# Patient Record
Sex: Female | Born: 2000 | Hispanic: No | Marital: Single | Smoking: Never smoker
Health system: Southern US, Community
[De-identification: ages and names within clinical notes are randomized; demographics above are authoritative.]

## PROBLEM LIST (undated history)

## (undated) DIAGNOSIS — A549 Gonococcal infection, unspecified: Secondary | ICD-10-CM

## (undated) DIAGNOSIS — R011 Cardiac murmur, unspecified: Secondary | ICD-10-CM

## (undated) HISTORY — PX: NO PAST SURGERIES: SHX2092

## (undated) HISTORY — DX: Gonococcal infection, unspecified: A54.9

## (undated) HISTORY — DX: Cardiac murmur, unspecified: R01.1

---
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
## 2019-01-12 ENCOUNTER — Encounter: Payer: Medicaid Other | Admitting: Women's Health

## 2019-01-17 ENCOUNTER — Encounter: Payer: Medicaid Other | Admitting: Women's Health

## 2019-01-21 NOTE — L&D Delivery Note (Addendum)
OB/GYN Faculty Practice Delivery Note  Daisy Thompson is a 19 y.o. G2P1001 s/p NSVD at [redacted]w[redacted]d. She was admitted for SOL.   ROM: 0h 6m with clear fluid GBS Status: unknown   Maximum Maternal Temperature: 98.7 F  Labor Progress: . Patient presented to L&D for SOL. Initial SVE: 8cm. Labor course was uncomplicated. 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 AROM at 1015. She then progressed to complete.   Delivery Date/Time: 03/24/2019 @1048  Delivery: Called to room and patient was complete and pushing. Head position was OA and delivered over the perineum causing a 2nd degree tear. Nuchal cord present and reduced. Shoulder and body delivered in usual fashion. Of note, after baby was delivered, partial cord avulsion noted near infant umbilical insertion. Infant without spontaneous cry and with poor tone so cord was clamped and cut by Dr. immediately and the baby was taken to the warmer, dried and stimulated. Cord blood drawn. Placenta delivered spontaneously with gentle cord traction. Fundus firm with massage and pitocin started. Labia, perineum, vagina, and cervix inspected and significant for 2nd degree perineal laceration with 3-0 vicryl in standard fashion.  Baby Weight:  3865g  Cord: central insertion, 3 vessel Placenta: Sent to pathology Complications: None Lacerations: 2nd degree perineal laceration, and was repaired in the standard fashion EBL: 350cc Analgesia: Non-medicated; local lidocaine for repair  Infant: APGAR (1 MIN):  6 APGAR (5 MINS): 9  Raynald Kemp MD, PGY-1 OBGYN Faculty Teaching Service  03/24/2019, 11:22 AM  OB FELLOW DELIVERY ATTESTATION  I was gloved and present for the delivery in its entirety, and I agree with the above resident's note.    05/24/2019, MD [facility] Family Medicine Fellow, [facility] for [facility]

## 2019-01-26 ENCOUNTER — Other Ambulatory Visit: Payer: Self-pay

## 2019-01-26 ENCOUNTER — Ambulatory Visit (INDEPENDENT_AMBULATORY_CARE_PROVIDER_SITE_OTHER): Payer: Medicaid Other | Admitting: Advanced Practice Midwife

## 2019-01-26 VITALS — BP 138/77 | HR 97 | Wt 166.0 lb

## 2019-01-26 DIAGNOSIS — Z331 Pregnant state, incidental: Secondary | ICD-10-CM

## 2019-01-26 DIAGNOSIS — A749 Chlamydial infection, unspecified: Secondary | ICD-10-CM | POA: Diagnosis not present

## 2019-01-26 DIAGNOSIS — Z3483 Encounter for supervision of other normal pregnancy, third trimester: Secondary | ICD-10-CM

## 2019-01-26 DIAGNOSIS — Z3A32 32 weeks gestation of pregnancy: Secondary | ICD-10-CM

## 2019-01-26 DIAGNOSIS — Z1389 Encounter for screening for other disorder: Secondary | ICD-10-CM

## 2019-01-26 LAB — POCT URINALYSIS DIPSTICK OB
Blood, UA: NEGATIVE
Glucose, UA: NEGATIVE
Ketones, UA: NEGATIVE
Leukocytes, UA: NEGATIVE
Nitrite, UA: NEGATIVE

## 2019-01-26 NOTE — Progress Notes (Signed)
   LOW-RISK PREGNANCY VISIT Patient name: Daisy Thompson MRN [id number]  Date of birth: June 23, 2000 Chief Complaint:   Routine Prenatal Visit  History of Present Illness:   Daisy Thompson is a 19 y.o. G77P1001 female at [redacted]w[redacted]d with an Estimated Date of Delivery: 03/17/19 being seen today for ongoing management of a low-risk pregnancy.  Today she reports no complaints. Contractions: Not present. Vag. Bleeding: None.  Movement: Present. denies leaking of fluid. Review of Systems:   Pertinent items are noted in HPI Denies abnormal vaginal discharge w/ itching/odor/irritation, headaches, visual changes, shortness of breath, chest pain, abdominal pain, severe nausea/vomiting, or problems with urination or bowel movements unless otherwise stated above. Pertinent History Reviewed:  Reviewed past medical,surgical, social, obstetrical and family history.  Reviewed problem list, medications and allergies. Physical Assessment:   Vitals:   01/26/19 1103  BP: 138/77  Pulse: 97  Weight: 166 lb (75.3 kg)  Body mass index is 27.62 kg/m.        Physical Examination:   General appearance: Well appearing, and in no distress  Mental status: Alert, oriented to person, place, and time  Skin: Warm & dry  Cardiovascular: Normal heart rate noted  Respiratory: Normal respiratory effort, no distress  Abdomen: Soft, gravid, nontender  Pelvic: Cervical exam deferred         Extremities: Edema: None  Fetal Status: Fetal Heart Rate (bpm): 134 Fundal Height: 31 cm Movement: Present    Results for orders placed or performed in visit on 01/26/19 (from the past 24 hour(s))  POC Urinalysis Dipstick OB   Collection Time: 01/26/19 11:08 AM  Result Value Ref Range   Color, UA     Clarity, UA     Glucose, UA Negative Negative   Bilirubin, UA     Ketones, UA neg    Spec Grav, UA     Blood, UA neg    pH, UA     POC,PROTEIN,UA Small (1+) Negative, Trace, Small (1+), Moderate (2+), Large (3+), 4+   Urobilinogen, UA       Nitrite, UA neg    Leukocytes, UA Negative Negative   Appearance     Odor      Assessment & Plan:  1) Low-risk pregnancy G2P1001 at [redacted]w[redacted]d with an Estimated Date of Delivery: 03/17/19   2) Hx depression, doing well without Zoloft  3) +chlam 12/01/18> took tx > TOC today   Meds: No orders of the defined types were placed in this encounter.  Labs/procedures today: GC/chlam  Plan:  Continue routine obstetrical care   Reviewed: Preterm labor symptoms and general obstetric precautions including but not limited to vaginal bleeding, contractions, leaking of fluid and fetal movement were reviewed in detail with the patient.  All questions were answered. Unsure if has home bp cuff. Check bp weekly, let us know if >140/90.   Follow-up: Return in about 3 weeks (around 02/16/2019) for LROB, in person.  Orders Placed This Encounter  Procedures  . GC/Chlamydia Probe Amp  . POC Urinalysis Dipstick OB   Arabella Merles [facility] 01/26/2019 11:42 AM

## 2019-01-26 NOTE — Patient Instructions (Signed)
Daisy Thompson, I greatly value your feedback.  If you receive a survey following your visit with [place] today, we appreciate you taking the time to fill it out.  Thanks, Philipp Deputy, CNM  [facility] HAS MOVED!!! It is now [facility] at [facility] ([address] [place], [place] [address]) Entrance located off of [address] Free 24/7 valet parking    Go to [email or web address] to register for FREE online childbirth classes   Call the office [phone]) or go to [facility] if:  You begin to have strong, frequent contractions  Your water breaks.  Sometimes it is a big gush of fluid, sometimes it is just a trickle that keeps getting your panties wet or running down your legs  You have vaginal bleeding.  It is normal to have a small amount of spotting if your cervix was checked.   You don't feel your baby moving like normal.  If you don't, get you something to eat and drink and lay down and focus on feeling your baby move.  You should feel at least 10 movements in 2 hours.  If you don't, you should call the office or go to [facility].    Tdap Vaccine  It is recommended that you get the Tdap vaccine during the third trimester of EACH pregnancy to help protect your baby from getting pertussis (whooping cough)  27-36 weeks is the BEST time to do this so that you can pass the protection on to your baby. During pregnancy is better than after pregnancy, but if you are unable to get it during pregnancy it will be offered at the hospital.   You can get this vaccine with [place], at the health department, your family doctor, or some local pharmacies  Everyone who will be around your baby should also be up-to-date on their vaccines before the baby comes. Adults (who are not pregnant) only need 1 dose of Tdap during adulthood.   [place] Pediatricians/Family Doctors:  [facility] [phone]            [facility] [phone]                  [facility] [phone] (usually not accepting new patients unless you have family there already, you are always welcome to call and ask)       [facility] Department [phone]       [facility] Pediatricians/Family Doctors:   [facility]: [phone]  [facility]: [phone]  [facility]: [phone]  [facility] Doctors:   [facility]: [phone]   [facility]: [phone]  [facility] Doctors:  [facility]: [phone]   Home Blood Pressure Monitoring for Patients   Your provider has recommended that you check your blood pressure (BP) at least once a week at home. If you do not have a blood pressure cuff at home, one will be provided for you. Contact your provider if you have not received your monitor within 1 week.   Helpful Tips for Accurate Home Blood Pressure Checks  . Don't smoke, exercise, or drink caffeine 30 minutes before checking your BP . Use the restroom before checking your BP (a full bladder can raise your pressure) . Relax in a comfortable upright chair . Feet on the ground . Left arm resting comfortably on a flat surface at the level of your heart . Legs uncrossed . Back supported . Sit quietly and don't talk .  Place the cuff on your bare arm . Adjust snuggly, so that only two fingertips can fit between your skin and the top of the cuff . Check 2 readings separated by at least one minute . Keep a log of your BP readings . For a visual, please reference this diagram: http://ccnc.care/bpdiagram  Provider Name: [facility]     Phone: [phone]  Zone 1: ALL CLEAR  Continue to monitor your symptoms:  . BP reading is less than 140 (top number) or less than 90 (bottom number)  . No right upper stomach pain . No headaches or seeing spots . No feeling nauseated or throwing up . No swelling in face and  hands  Zone 2: CAUTION Call your doctor's office for any of the following:  . BP reading is greater than 140 (top number) or greater than 90 (bottom number)  . Stomach pain under your ribs in the middle or right side . Headaches or seeing spots . Feeling nauseated or throwing up . Swelling in face and hands  Zone 3: EMERGENCY  Seek immediate medical care if you have any of the following:  . BP reading is greater than160 (top number) or greater than 110 (bottom number) . Severe headaches not improving with Tylenol . Serious difficulty catching your breath . Any worsening symptoms from Zone 2   Third Trimester of Pregnancy The third trimester is from week 29 through week 42, months 7 through 9. The third trimester is a time when the fetus is growing rapidly. At the end of the ninth month, the fetus is about 20 inches in length and weighs 6-10 pounds.  BODY CHANGES Your body goes through many changes during pregnancy. The changes vary from woman to woman.   Your weight will continue to increase. You can expect to gain 25-35 pounds (11-16 kg) by the end of the pregnancy.  You may begin to get stretch marks on your hips, abdomen, and breasts.  You may urinate more often because the fetus is moving lower into your pelvis and pressing on your bladder.  You may develop or continue to have heartburn as a result of your pregnancy.  You may develop constipation because certain hormones are causing the muscles that push waste through your intestines to slow down.  You may develop hemorrhoids or swollen, bulging veins (varicose veins).  You may have pelvic pain because of the weight gain and pregnancy hormones relaxing your joints between the bones in your pelvis. Backaches may result from overexertion of the muscles supporting your posture.  You may have changes in your hair. These can include thickening of your hair, rapid growth, and changes in texture. Some women also have hair loss during  or after pregnancy, or hair that feels dry or thin. Your hair will most likely return to normal after your baby is born.  Your breasts will continue to grow and be tender. A yellow discharge may leak from your breasts called colostrum.  Your belly button may stick out.  You may feel short of breath because of your expanding uterus.  You may notice the fetus "dropping," or moving lower in your abdomen.  You may have a bloody mucus discharge. This usually occurs a few days to a week before labor begins.  Your cervix becomes thin and soft (effaced) near your due date. WHAT TO EXPECT AT YOUR PRENATAL EXAMS  You will have prenatal exams every 2 weeks until week 36. Then, you will have weekly prenatal exams. During  a routine prenatal visit:  You will be weighed to make sure you and the fetus are growing normally.  Your blood pressure is taken.  Your abdomen will be measured to track your baby's growth.  The fetal heartbeat will be listened to.  Any test results from the previous visit will be discussed.  You may have a cervical check near your due date to see if you have effaced. At around 36 weeks, your caregiver will check your cervix. At the same time, your caregiver will also perform a test on the secretions of the vaginal tissue. This test is to determine if a type of bacteria, Group B streptococcus, is present. Your caregiver will explain this further. Your caregiver may ask you:  What your birth plan is.  How you are feeling.  If you are feeling the baby move.  If you have had any abnormal symptoms, such as leaking fluid, bleeding, severe headaches, or abdominal cramping.  If you have any questions. Other tests or screenings that may be performed during your third trimester include:  Blood tests that check for low iron levels (anemia).  Fetal testing to check the health, activity level, and growth of the fetus. Testing is done if you have certain medical conditions or if  there are problems during the pregnancy. FALSE LABOR You may feel small, irregular contractions that eventually go away. These are called Braxton Hicks contractions, or false labor. Contractions may last for hours, days, or even weeks before true labor sets in. If contractions come at regular intervals, intensify, or become painful, it is best to be seen by your caregiver.  SIGNS OF LABOR   Menstrual-like cramps.  Contractions that are 5 minutes apart or less.  Contractions that start on the top of the uterus and spread down to the lower abdomen and back.  A sense of increased pelvic pressure or back pain.  A watery or bloody mucus discharge that comes from the vagina. If you have any of these signs before the 37th week of pregnancy, call your caregiver right away. You need to go to the hospital to get checked immediately. HOME CARE INSTRUCTIONS   Avoid all smoking, herbs, alcohol, and unprescribed drugs. These chemicals affect the formation and growth of the baby.  Follow your caregiver's instructions regarding medicine use. There are medicines that are either safe or unsafe to take during pregnancy.  Exercise only as directed by your caregiver. Experiencing uterine cramps is a good sign to stop exercising.  Continue to eat regular, healthy meals.  Wear a good support bra for breast tenderness.  Do not use hot tubs, steam rooms, or saunas.  Wear your seat belt at all times when driving.  Avoid raw meat, uncooked cheese, cat litter boxes, and soil used by cats. These carry germs that can cause birth defects in the baby.  Take your prenatal vitamins.  Try taking a stool softener (if your caregiver approves) if you develop constipation. Eat more high-fiber foods, such as fresh vegetables or fruit and whole grains. Drink plenty of fluids to keep your urine clear or pale yellow.  Take warm sitz baths to soothe any pain or discomfort caused by hemorrhoids. Use hemorrhoid cream if your  caregiver approves.  If you develop varicose veins, wear support hose. Elevate your feet for 15 minutes, 3-4 times a day. Limit salt in your diet.  Avoid heavy lifting, wear low heal shoes, and practice good posture.  Rest a lot with your legs elevated if you  have leg cramps or low back pain.  Visit your dentist if you have not gone during your pregnancy. Use a soft toothbrush to brush your teeth and be gentle when you floss.  A sexual relationship may be continued unless your caregiver directs you otherwise.  Do not travel far distances unless it is absolutely necessary and only with the approval of your caregiver.  Take prenatal classes to understand, practice, and ask questions about the labor and delivery.  Make a trial run to the hospital.  Pack your hospital bag.  Prepare the baby's nursery.  Continue to go to all your prenatal visits as directed by your caregiver. SEEK MEDICAL CARE IF:  You are unsure if you are in labor or if your water has broken.  You have dizziness.  You have mild pelvic cramps, pelvic pressure, or nagging pain in your abdominal area.  You have persistent nausea, vomiting, or diarrhea.  You have a bad smelling vaginal discharge.  You have pain with urination. SEEK IMMEDIATE MEDICAL CARE IF:   You have a fever.  You are leaking fluid from your vagina.  You have spotting or bleeding from your vagina.  You have severe abdominal cramping or pain.  You have rapid weight loss or gain.  You have shortness of breath with chest pain.  You notice sudden or extreme swelling of your face, hands, ankles, feet, or legs.  You have not felt your baby move in over an hour.  You have severe headaches that do not go away with medicine.  You have vision changes. Document Released: 12/31/2000 Document Revised: 01/11/2013 Document Reviewed: 03/09/2012 [facility] Patient Information 2015 [place], [place]. This information is not intended to replace advice  given to you by your health care provider. Make sure you discuss any questions you have with your health care provider.  PROTECT YOURSELF & YOUR BABY FROM THE FLU! Because you are pregnant, we at [facility], along with the Centers for Disease Control (CDC), recommend that you receive the flu vaccine to protect yourself and your baby from the flu. The flu is more likely to cause severe illness in pregnant women than in women of reproductive age who are not pregnant. Changes in the immune system, heart, and lungs during pregnancy make pregnant women (and women up to two weeks postpartum) more prone to severe illness from flu, including illness resulting in hospitalization. Flu also may be harmful for a pregnant woman's developing baby. A common flu symptom is fever, which may be associated with neural tube defects and other adverse outcomes for a developing baby. Getting vaccinated can also help protect a baby after birth from flu. (Mom passes antibodies onto the developing baby during her pregnancy.)  A Flu Vaccine is the Best Protection Against Flu Getting a flu vaccine is the first and most important step in protecting against flu. Pregnant women should get a flu shot and not the live attenuated influenza vaccine (LAIV), also known as nasal spray flu vaccine. Flu vaccines given during pregnancy help protect both the mother and her baby from flu. Vaccination has been shown to reduce the risk of flu-associated acute respiratory infection in pregnant women by up to one-half. A 2018 study showed that getting a flu shot reduced a pregnant woman's risk of being hospitalized with flu by an average of 40 percent. Pregnant women who get a flu vaccine are also helping to protect their babies from flu illness for the first several months after their birth, when they are too  young to get vaccinated.   A Long Record of Safety for Flu Shots in Pregnant Women Flu shots have been given to millions of pregnant women over  many years with a good safety record. There is a lot of evidence that flu vaccines can be given safely during pregnancy; though these data are limited for the first trimester. The CDC recommends that pregnant women get vaccinated during any trimester of their pregnancy. It is very important for pregnant women to get the flu shot.   Other Preventive Actions In addition to getting a flu shot, pregnant women should take the same everyday preventive actions the CDC recommends of everyone, including covering coughs, washing hands often, and avoiding people who are sick.  Symptoms and Treatment If you get sick with flu symptoms call your doctor right away. There are antiviral drugs that can treat flu illness and prevent serious flu complications. The CDC recommends prompt treatment for people who have influenza infection or suspected influenza infection and who are at high risk of serious flu complications, such as people with asthma, diabetes (including gestational diabetes), or heart disease. Early treatment of influenza in hospitalized pregnant women has been shown to reduce the length of the hospital stay.  Symptoms Flu symptoms include fever, cough, sore throat, runny or stuffy nose, body aches, headache, chills and fatigue. Some people may also have vomiting and diarrhea. People may be infected with the flu and have respiratory symptoms without a fever.  Early Treatment is Important for Pregnant Women Treatment should begin as soon as possible because antiviral drugs work best when started early (within 48 hours after symptoms start). Antiviral drugs can make your flu illness milder and make you feel better faster. They may also prevent serious health problems that can result from flu illness. Oral oseltamivir (Tamiflu) is the preferred treatment for pregnant women because it has the most studies available to suggest that it is safe and beneficial. Antiviral drugs require a prescription from your  provider. Having a fever caused by flu infection or other infections early in pregnancy may be linked to birth defects in a baby. In addition to taking antiviral drugs, pregnant women who get a fever should treat their fever with Tylenol (acetaminophen) and contact their provider immediately.  When to [place] If you are pregnant and have any of these signs, seek care immediately:  Difficulty breathing or shortness of breath  Pain or pressure in the chest or abdomen  Sudden dizziness  Confusion  Severe or persistent vomiting  High fever that is not responding to Tylenol (or store brand equivalent)  Decreased or no movement of your baby  [email or web address]

## 2019-01-27 LAB — GC/CHLAMYDIA PROBE AMP
Chlamydia trachomatis, NAA: NEGATIVE
Neisseria Gonorrhoeae by PCR: NEGATIVE

## 2019-02-09 ENCOUNTER — Encounter: Payer: Medicaid Other | Admitting: Obstetrics and Gynecology

## 2019-02-10 ENCOUNTER — Ambulatory Visit (INDEPENDENT_AMBULATORY_CARE_PROVIDER_SITE_OTHER): Payer: Medicaid Other | Admitting: Obstetrics & Gynecology

## 2019-02-10 ENCOUNTER — Other Ambulatory Visit: Payer: Self-pay

## 2019-02-10 VITALS — BP 112/66 | HR 96 | Wt 167.0 lb

## 2019-02-10 DIAGNOSIS — Z7689 Persons encountering health services in other specified circumstances: Secondary | ICD-10-CM | POA: Diagnosis not present

## 2019-02-10 DIAGNOSIS — Z3A35 35 weeks gestation of pregnancy: Secondary | ICD-10-CM

## 2019-02-10 DIAGNOSIS — Z3493 Encounter for supervision of normal pregnancy, unspecified, third trimester: Secondary | ICD-10-CM

## 2019-02-10 NOTE — Progress Notes (Signed)
   LOW-RISK PREGNANCY VISIT Patient name: Daisy Thompson MRN [id number]  Date of birth: [birth year] Chief Complaint:   Routine Prenatal Visit  History of Present Illness:   Daisy Thompson is a 19 y.o. G67P1001 female at [redacted]w[redacted]d with an Estimated Date of Delivery: 03/17/19 being seen today for ongoing management of a low-risk pregnancy.  Today she reports no complaints. Contractions: Not present. Vag. Bleeding: None.  Movement: Present. denies leaking of fluid. Review of Systems:   Pertinent items are noted in HPI Denies abnormal vaginal discharge w/ itching/odor/irritation, headaches, visual changes, shortness of breath, chest pain, abdominal pain, severe nausea/vomiting, or problems with urination or bowel movements unless otherwise stated above. Pertinent History Reviewed:  Reviewed past medical,surgical, social, obstetrical and family history.  Reviewed problem list, medications and allergies. Physical Assessment:   Vitals:   02/10/19 1335  BP: 112/66  Pulse: 96  Weight: 167 lb (75.8 kg)  Body mass index is 27.79 kg/m.        Physical Examination:   General appearance: Well appearing, and in no distress  Mental status: Alert, oriented to person, place, and time  Skin: Warm & dry  Cardiovascular: Normal heart rate noted  Respiratory: Normal respiratory effort, no distress  Abdomen: Soft, gravid, nontender  Pelvic: Cervical exam deferred         Extremities: Edema: None  Fetal Status: Fetal Heart Rate (bpm): 144 Fundal Height: 33 cm Movement: Present Presentation: Vertex  Chaperone: n/a    No results found for this or any previous visit (from the past 24 hour(s)).  Assessment & Plan:  1) Low-risk pregnancy G2P1001 at [redacted]w[redacted]d with an Estimated Date of Delivery: 03/17/19      Meds: No orders of the defined types were placed in this encounter.  Labs/procedures today:   Plan:  Continue routine obstetrical care  Next visit: prefers in person    Reviewed: Preterm labor symptoms and  general obstetric precautions including but not limited to vaginal bleeding, contractions, leaking of fluid and fetal movement were reviewed in detail with the patient.  All questions were answered. has home bp cuff. Rx faxed to . Check bp weekly, let us know if >140/90.   Follow-up: Return in about 2 weeks (around 02/24/2019) for LROB.  No orders of the defined types were placed in this encounter.  Lazaro Arms  02/10/2019 2:03 PM

## 2019-02-24 ENCOUNTER — Encounter: Payer: Medicaid Other | Admitting: Advanced Practice Midwife

## 2019-03-24 ENCOUNTER — Other Ambulatory Visit: Payer: Medicaid Other | Admitting: Women's Health

## 2019-03-24 ENCOUNTER — Encounter (HOSPITAL_COMMUNITY): Payer: Self-pay | Admitting: Obstetrics & Gynecology

## 2019-03-24 ENCOUNTER — Other Ambulatory Visit: Payer: Self-pay

## 2019-03-24 ENCOUNTER — Inpatient Hospital Stay (HOSPITAL_COMMUNITY)
Admission: AD | Admit: 2019-03-24 | Discharge: 2019-03-26 | DRG: 807 | Disposition: A | Discharge status: Home / Self Care | Payer: Medicaid Other | Attending: Obstetrics & Gynecology | Admitting: Obstetrics & Gynecology

## 2019-03-24 DIAGNOSIS — Z3493 Encounter for supervision of normal pregnancy, unspecified, third trimester: Secondary | ICD-10-CM

## 2019-03-24 DIAGNOSIS — A749 Chlamydial infection, unspecified: Secondary | ICD-10-CM | POA: Diagnosis present

## 2019-03-24 DIAGNOSIS — R52 Pain, unspecified: Secondary | ICD-10-CM | POA: Diagnosis not present

## 2019-03-24 DIAGNOSIS — Z3A41 41 weeks gestation of pregnancy: Secondary | ICD-10-CM

## 2019-03-24 DIAGNOSIS — F419 Anxiety disorder, unspecified: Secondary | ICD-10-CM | POA: Diagnosis present

## 2019-03-24 DIAGNOSIS — O480 Post-term pregnancy: Secondary | ICD-10-CM | POA: Diagnosis not present

## 2019-03-24 DIAGNOSIS — Z20822 Contact with and (suspected) exposure to covid-19: Secondary | ICD-10-CM | POA: Diagnosis present

## 2019-03-24 DIAGNOSIS — O99344 Other mental disorders complicating childbirth: Secondary | ICD-10-CM | POA: Diagnosis present

## 2019-03-24 DIAGNOSIS — O0932 Supervision of pregnancy with insufficient antenatal care, second trimester: Secondary | ICD-10-CM

## 2019-03-24 DIAGNOSIS — Z8659 Personal history of other mental and behavioral disorders: Secondary | ICD-10-CM

## 2019-03-24 DIAGNOSIS — O479 False labor, unspecified: Secondary | ICD-10-CM | POA: Diagnosis not present

## 2019-03-24 LAB — RAPID URINE DRUG SCREEN, HOSP PERFORMED
Amphetamines: NOT DETECTED
Barbiturates: NOT DETECTED
Benzodiazepines: NOT DETECTED
Cocaine: NOT DETECTED
Opiates: NOT DETECTED
Tetrahydrocannabinol: NOT DETECTED

## 2019-03-24 LAB — RESPIRATORY PANEL BY RT PCR (FLU A&B, COVID)
Influenza A by PCR: NEGATIVE
Influenza B by PCR: NEGATIVE
SARS Coronavirus 2 by RT PCR: NEGATIVE

## 2019-03-24 LAB — CBC
HCT: 30.9 % — ABNORMAL LOW (ref 36.0–46.0)
Hemoglobin: 8.7 g/dL — ABNORMAL LOW (ref 12.0–15.0)
MCH: 20.2 pg — ABNORMAL LOW (ref 26.0–34.0)
MCHC: 28.2 g/dL — ABNORMAL LOW (ref 30.0–36.0)
MCV: 71.9 fL — ABNORMAL LOW (ref 80.0–100.0)
Platelets: 133 10*3/uL — ABNORMAL LOW (ref 150–400)
RBC: 4.3 MIL/uL (ref 3.87–5.11)
RDW: 18 % — ABNORMAL HIGH (ref 11.5–15.5)
WBC: 13.1 10*3/uL — ABNORMAL HIGH (ref 4.0–10.5)
nRBC: 0.2 % (ref 0.0–0.2)

## 2019-03-24 LAB — RPR: RPR Ser Ql: NONREACTIVE

## 2019-03-24 LAB — ABO/RH: ABO/RH(D): O POS

## 2019-03-24 LAB — TYPE AND SCREEN
ABO/RH(D): O POS
Antibody Screen: NEGATIVE

## 2019-03-24 MED ORDER — IBUPROFEN 600 MG PO TABS
600.0000 mg | ORAL_TABLET | Freq: Four times a day (QID) | ORAL | Status: DC
  Administered 2019-03-24 – 2019-03-25 (×7): 600 mg via ORAL
  Filled 2019-03-24 (×8): qty 1

## 2019-03-24 MED ORDER — ACETAMINOPHEN 325 MG PO TABS: 650.0000 mg | ORAL_TABLET | ORAL | Status: DC | PRN

## 2019-03-24 MED ORDER — OXYCODONE HCL 5 MG PO TABS: 10.0000 mg | ORAL_TABLET | ORAL | Status: DC | PRN

## 2019-03-24 MED ORDER — WITCH HAZEL-GLYCERIN EX PADS: 1.0000 "application " | MEDICATED_PAD | CUTANEOUS | Status: DC | PRN

## 2019-03-24 MED ORDER — MEDROXYPROGESTERONE ACETATE 150 MG/ML IM SUSP
150.0000 mg | Freq: Once | INTRAMUSCULAR | Status: AC
  Administered 2019-03-25: 150 mg via INTRAMUSCULAR
  Filled 2019-03-24: qty 1

## 2019-03-24 MED ORDER — METHYLERGONOVINE MALEATE 0.2 MG/ML IJ SOLN: 0.2000 mg | INTRAMUSCULAR | Status: DC | PRN

## 2019-03-24 MED ORDER — OXYTOCIN 40 UNITS IN NORMAL SALINE INFUSION - SIMPLE MED: 2.5000 [IU]/h | INTRAVENOUS | Status: DC

## 2019-03-24 MED ORDER — FENTANYL CITRATE (PF) 100 MCG/2ML IJ SOLN
50.0000 ug | Freq: Once | INTRAMUSCULAR | Status: AC
  Administered 2019-03-24: 50 ug via INTRAVENOUS

## 2019-03-24 MED ORDER — DIPHENHYDRAMINE HCL 25 MG PO CAPS: 25.0000 mg | ORAL_CAPSULE | Freq: Four times a day (QID) | ORAL | Status: DC | PRN

## 2019-03-24 MED ORDER — METHYLERGONOVINE MALEATE 0.2 MG PO TABS: 0.2000 mg | ORAL_TABLET | ORAL | Status: DC | PRN

## 2019-03-24 MED ORDER — OXYTOCIN 40 UNITS IN NORMAL SALINE INFUSION - SIMPLE MED
INTRAVENOUS | Status: AC
  Filled 2019-03-24: qty 1000

## 2019-03-24 MED ORDER — OXYTOCIN BOLUS FROM INFUSION
500.0000 mL | Freq: Once | INTRAVENOUS | Status: AC
  Administered 2019-03-24: 500 mL via INTRAVENOUS

## 2019-03-24 MED ORDER — ONDANSETRON HCL 4 MG PO TABS: 4.0000 mg | ORAL_TABLET | ORAL | Status: DC | PRN

## 2019-03-24 MED ORDER — LIDOCAINE HCL (PF) 1 % IJ SOLN
30.0000 mL | INTRAMUSCULAR | Status: AC | PRN
  Administered 2019-03-24: 11:00:00 30 mL via SUBCUTANEOUS
  Filled 2019-03-24: qty 30

## 2019-03-24 MED ORDER — PRENATAL MULTIVITAMIN CH
1.0000 | ORAL_TABLET | Freq: Every day | ORAL | Status: DC
  Administered 2019-03-24 – 2019-03-25 (×2): 1 via ORAL
  Filled 2019-03-24 (×2): qty 1

## 2019-03-24 MED ORDER — DIBUCAINE (PERIANAL) 1 % EX OINT: 1.0000 "application " | TOPICAL_OINTMENT | CUTANEOUS | Status: DC | PRN

## 2019-03-24 MED ORDER — COCONUT OIL OIL: 1.0000 "application " | TOPICAL_OIL | Status: DC | PRN

## 2019-03-24 MED ORDER — SOD CITRATE-CITRIC ACID 500-334 MG/5ML PO SOLN: 30.0000 mL | ORAL | Status: DC | PRN

## 2019-03-24 MED ORDER — ONDANSETRON HCL 4 MG/2ML IJ SOLN: 4.0000 mg | INTRAMUSCULAR | Status: DC | PRN

## 2019-03-24 MED ORDER — ONDANSETRON HCL 4 MG/2ML IJ SOLN: 4.0000 mg | Freq: Four times a day (QID) | INTRAMUSCULAR | Status: DC | PRN

## 2019-03-24 MED ORDER — TETANUS-DIPHTH-ACELL PERTUSSIS 5-2.5-18.5 LF-MCG/0.5 IM SUSP: 0.5000 mL | Freq: Once | INTRAMUSCULAR | Status: DC

## 2019-03-24 MED ORDER — TRANEXAMIC ACID-NACL 1000-0.7 MG/100ML-% IV SOLN
1000.0000 mg | INTRAVENOUS | Status: AC
  Administered 2019-03-24: 1000 mg via INTRAVENOUS
  Filled 2019-03-24: qty 100

## 2019-03-24 MED ORDER — OXYTOCIN 40 UNITS IN NORMAL SALINE INFUSION - SIMPLE MED: 2.5000 [IU]/h | INTRAVENOUS | Status: DC | PRN

## 2019-03-24 MED ORDER — LACTATED RINGERS IV SOLN: 500.0000 mL | INTRAVENOUS | Status: DC | PRN

## 2019-03-24 MED ORDER — OXYCODONE HCL 5 MG PO TABS: 5.0000 mg | ORAL_TABLET | ORAL | Status: DC | PRN

## 2019-03-24 MED ORDER — SIMETHICONE 80 MG PO CHEW: 80.0000 mg | CHEWABLE_TABLET | ORAL | Status: DC | PRN

## 2019-03-24 MED ORDER — LACTATED RINGERS IV SOLN: INTRAVENOUS | Status: DC

## 2019-03-24 MED ORDER — FENTANYL CITRATE (PF) 100 MCG/2ML IJ SOLN
INTRAMUSCULAR | Status: AC
  Filled 2019-03-24: qty 2

## 2019-03-24 MED ORDER — BENZOCAINE-MENTHOL 20-0.5 % EX AERO: 1.0000 "application " | INHALATION_SPRAY | CUTANEOUS | Status: DC | PRN

## 2019-03-24 NOTE — Discharge Summary (Signed)
Postpartum Discharge Summary      Patient Name: Daisy Thompson DOB: 07-20-2000 MRN: [id number]  Date of admission: 03/24/2019 Delivering Provider: Chauncey Mann   Date of discharge: 03/26/2019  Admitting diagnosis: Labor and delivery, indication for care [O75.9] Intrauterine pregnancy: [redacted]w[redacted]d    Secondary diagnosis:  Active Problems:   Anxiety   History of postpartum depression   Late prenatal care in second trimester   Chlamydia infection affecting pregnancy in second trimester   Labor and delivery, indication for care   [redacted] weeks gestation of pregnancy  Additional problems: None     Discharge diagnosis: Term Pregnancy Delivered                                                                                                Post partum procedures:DMPA injection  Augmentation: AROM  Complications: None  Hospital course:  Onset of Labor With Vaginal Delivery     19y.o. yo G2P2002 at 454w0das admitted in Active Labor on 03/24/2019. Patient had an uncomplicated labor course as follows: Patient presented to L&D for SOL. Initial SVE: 8cm. Labor course was uncomplicated. Received TXA for hx of PPH with blood transfusion. AROM at 1015. She then progressed to complete.  Membrane Rupture Time/Date: 10:15 AM ,03/24/2019   Intrapartum Procedures: Episiotomy:                                          Lacerations:  2nd degree [3];Perineal [11]  Patient had a delivery of a Viable infant. 03/24/2019  Information for the patient's newborn:  GaSafia, Panzer0[[id number]]Delivery Method: Vaginal, Spontaneous(Filed from Delivery Summary)     Pateint had an uncomplicated postpartum course. Had originally wanted Nexplanon placed, but changed her mind and requested a DMPA injection prior to discharge. Hx of post-partum depression, patient monitored and started on Zoloft 2512mrior to d/c. She had a SW consult with no barriers to infant d/c but she will have CPS/SW f/u once home due to a case with her  previous child. She is ambulating, tolerating a regular diet, passing flatus, and urinating well. Patient is discharged home in stable condition on 03/26/19.  Delivery time: 10:48 AM    Magnesium Sulfate received: No BMZ received: No Rhophylac:N/A MMR:N/A Transfusion:No  Physical exam  Vitals:   03/25/19 0500 03/25/19 1434 03/25/19 1959 03/26/19 0435  BP: 122/76 119/78 121/75 137/72  Pulse: 87 75 72 92  Resp: _0 Temp: 97.6 F (36.4 C) 98.4 F (36.9 C) 98.2 F (36.8 C) 98.4 F (36.9 C)  TempSrc: Oral Oral  Oral  SpO2: 100%     Height:       General: alert and cooperative Lochia: appropriate Uterine Fundus: firm Incision: N/A DVT Evaluation: No evidence of DVT seen on physical exam. Labs: Lab Results  Component Value Date   WBC 14.0 (H) 03/25/2019   HGB 7.3 (L) 03/25/2019   HCT 25.6 (L) 03/25/2019   MCV 70.5 (L) 03/25/2019  PLT PLATELET CLUMPS NOTED ON SMEAR, UNABLE TO ESTIMATE 03/25/2019   CMP Latest Ref Rng & Units 10/15/2017  Glucose 70 - 99 mg/dL 82  BUN 4 - 18 mg/dL 10  Creatinine 0.50 - 1.00 mg/dL 0.64  Sodium 135 - 145 mmol/L 138  Potassium 3.5 - 5.1 mmol/L 3.8  Chloride 98 - 111 mmol/L 109  CO2 22 - 32 mmol/L 20(L)  Calcium 8.9 - 10.3 mg/dL 8.7(L)  Total Protein 6.5 - 8.1 g/dL 7.1  Total Bilirubin 0.3 - 1.2 mg/dL 0.5  Alkaline Phos 47 - 119 U/L 199(H)  AST 15 - 41 U/L 23  ALT 0 - 44 U/L 15   Edinburgh Score: Edinburgh Postnatal Depression Scale Screening Tool 03/24/2019  I have been able to laugh and see the funny side of things. 1  I have looked forward with enjoyment to things. 0  I have blamed myself unnecessarily when things went wrong. 0  I have been anxious or worried for no good reason. 1  I have felt scared or panicky for no good reason. 0  Things have been getting on top of me. 0  I have been so unhappy that I have had difficulty sleeping. 1  I have felt sad or miserable. 0  I have been so unhappy that I have been crying. 0  The  thought of harming myself has occurred to me. 0  Edinburgh Postnatal Depression Scale Total 3    Discharge instruction: per After Visit Summary and "Baby and Me Booklet".  After visit meds:  Allergies as of 03/26/2019   No Known Allergies     Medication List    STOP taking these medications   Blood Pressure Monitor Misc     TAKE these medications   Ferrous Fumarate 324 (106 Fe) MG Tabs tablet Commonly known as: HEMOCYTE - 106 mg FE Take 1 tablet (106 mg of iron total) by mouth daily.   ibuprofen 600 MG tablet Commonly known as: ADVIL Take 1 tablet (600 mg total) by mouth every 6 (six) hours as needed.   PNV Prenatal Plus Multivitamin 27-1 MG Tabs Take 1 tablet by mouth daily.   sertraline 25 MG tablet Commonly known as: ZOLOFT Take 1 tablet (25 mg total) by mouth daily.       Diet: routine diet  Activity: Advance as tolerated. Pelvic rest for 6 weeks.   Outpatient follow up:4 weeks Follow up Appt: Future Appointments  Date Time Provider [place]  04/28/2019 10:30 AM Cresenzo-Dishmon, Joaquim Lai, CNM [facility]   Follow up Visit:    Please schedule this patient for Postpartum visit in: 4 weeks with the following provider: Any provider Virtual For C/S patients schedule nurse incision check in weeks 2 weeks: no Low risk pregnancy complicated by: none Delivery mode:  SVD Anticipated Birth Control:  Nexplanon PP Procedures needed: none  Schedule Integrated [place] visit: yes     Newborn Data: Live born female  Birth Weight: 8 lb 8.3 oz (3865 g) APGAR: 6, 9  Newborn Delivery   Birth date/time: 03/24/2019 10:48:00 Delivery type: Vaginal, Spontaneous      Baby Feeding: Bottle Disposition:home with mother   03/26/2019 Myrtis Ser, CNM  8:25 AM

## 2019-03-24 NOTE — Progress Notes (Signed)
Pt will not let this RN perform SVE,  Involuntarily pushing.

## 2019-03-24 NOTE — MAU Note (Signed)
Pt reports that she started having contractions about 0455 and the intensity picked up.  Pt states she feels like she has to pee however does not think that her water has not broken.

## 2019-03-24 NOTE — H&P (Addendum)
OBSTETRIC ADMISSION HISTORY AND PHYSICAL  Daisy Thompson is a 19 y.o. female G2P1001 with IUP at [redacted]w[redacted]d by 17 wk [place] presenting for SOL. She reports +FMs, No LOF, no VB, no blurry vision, headaches or peripheral edema, and RUQ pain.  She plans on bottle feeding. She requests Nexplanon for birth control. She received her prenatal care at [facility]   Dating: By 17 wk [place] --->  Estimated Date of Delivery: 03/17/19  Sono:  9/22  [place] 17+5 wks,cephalic,fhr 154 bpm,svp of fluid 3.6 cm,cx 3 cm,posterior fundal placenta gr 0,normal ovaries bilat,EFW 211 g,anatomy complete,no obvious abnormalities,EDD 03/17/2019 by today's ultrasound   Prenatal History/Complications: Hx of PPH with 2u RBC last delivery Hx of PPD Insufficient PNC Hx of Chlamydia in second trimester with negative TOC GBS unknown   Past Medical History: Past Medical History:  Diagnosis Date  . Gonorrhea   . Heart murmur   . Prematurity     Past Surgical History: Past Surgical History:  Procedure Laterality Date  . NO PAST SURGERIES      Obstetrical History: OB History    Gravida  2   Para  1   Term  1   Preterm  0   AB  0   Living  1     SAB  0   TAB  0   Ectopic  0   Multiple  0   Live Births  1           Social History: Social History   Socioeconomic History  . Marital status: Single    Spouse name: Not on file  . Number of children: Not on file  . Years of education: Not on file  . Highest education level: Not on file  Occupational History  . Not on file  Tobacco Use  . Smoking status: Never Smoker  . Smokeless tobacco: Never Used  Substance and Sexual Activity  . Alcohol use: No  . Drug use: No    Types: Marijuana    Comment: denies  . Sexual activity: Yes    Birth control/protection: None  Other Topics Concern  . Not on file  Social History Narrative   Lives with aunt       9th grade    Social Determinants of Health   Financial Resource Strain:   . Difficulty of Paying  Living Expenses: Not on file  Food Insecurity:   . Worried About Programme researcher, broadcasting/film/video in the Last Year: Not on file  . Ran Out of Food in the Last Year: Not on file  Transportation Needs:   . Lack of Transportation (Medical): Not on file  . Lack of Transportation (Non-Medical): Not on file  Physical Activity:   . Days of Exercise per Week: Not on file  . Minutes of Exercise per Session: Not on file  Stress:   . Feeling of Stress : Not on file  Social Connections:   . Frequency of Communication with Friends and Family: Not on file  . Frequency of Social Gatherings with Friends and Family: Not on file  . Attends Religious Services: Not on file  . Active Member of Clubs or Organizations: Not on file  . Attends Banker Meetings: Not on file  . Marital Status: Not on file    Family History: Family History  Problem Relation Age of Onset  . Thyroid disease Mother   . ADD / ADHD Brother   . Asthma Brother   . Diabetes Maternal Grandmother   .  Hypertension Maternal Grandmother     Allergies: No Known Allergies  Medications Prior to Admission  Medication Sig Dispense Refill Last Dose  . Blood Pressure Monitor MISC For regular home bp monitoring during pregnancy 1 each 0 Past Week at Unknown time  . Ferrous Fumarate (HEMOCYTE - 106 MG FE) 324 (106 Fe) MG TABS tablet Take 1 tablet (106 mg of iron total) by mouth daily. 30 tablet 5 Past Week at Unknown time  . Prenatal Vit-Fe Fumarate-FA (PNV PRENATAL PLUS MULTIVITAMIN) 27-1 MG TABS Take 1 tablet by mouth daily. 30 tablet 11 03/23/2019 at Unknown time  . Melatonin 1 MG TABS Take by mouth.   Unknown at Unknown time  . sertraline (ZOLOFT) 25 MG tablet Take 1 tablet (25 mg total) by mouth daily. (Patient not taking: Reported on 11/04/2018) 30 tablet 6 Unknown at Unknown time     Review of Systems   All systems reviewed and negative except as stated in HPI  Blood pressure 134/80, pulse 75, temperature 97.9 F (36.6 C),  temperature source Oral, SpO2 100 %, currently breastfeeding. General appearance: alert, cooperative and appears stated age Lungs: normal effort Heart: regular rate  Abdomen: soft, non-tender; bowel sounds normal Pelvic: gravid uterus Extremities: Homans sign is negative, no sign of DVT Presentation: cephalic by BSUS Fetal monitoringBaseline: 120 bpm, Variability: Good {> 6 bpm), Accelerations: Reactive and Decelerations: Absent Uterine activity: Frequency: Every 2-3 minutes Dilation: 8 Effacement (%): 100 Exam by:: jolynn   Prenatal labs: ABO, Rh: O/Positive/-- (10/15 1436) Antibody: Negative (12/02 0925) Rubella: 1.31 (10/15 1436) RPR: Non Reactive (12/02 0925)  HBsAg: Negative (10/15 1436)  HIV: Non Reactive (12/02 0925)  GBS:    2 hr Glucola WNL Genetic screening  Normal female  Anatomy US WNL  Prenatal Transfer Tool  Maternal Diabetes: No Genetic Screening: Normal Maternal Ultrasounds/Referrals: Normal Fetal Ultrasounds or other Referrals:  None Maternal Substance Abuse:  No Significant Maternal Medications:  None Significant Maternal Lab Results: GBS unknown   No results found for this or any previous visit (from the past 24 hour(s)).  Patient Active Problem List   Diagnosis Date Noted  . Labor and delivery, indication for care 03/24/2019  . [redacted] weeks gestation of pregnancy 03/24/2019  . Chlamydia infection affecting pregnancy in second trimester 12/06/2018  . Late prenatal care in second trimester 11/04/2018  . Supervision of normal pregnancy 11/04/2018  . Postpartum hemorrhage 11/16/2017  . History of postpartum depression 11/16/2017  . Inadequate social support 08/28/2017  . Anxiety 04/07/2017  . History of maternal cardiac surgery 04/07/2017    Assessment/Plan:  Daisy Thompson is a 19 y.o. G2P1001 at [redacted]w[redacted]d here for SOL.  #Labor: Vertex by BSUS. 8 cm on presentation. Expectant management. AROM as indicated. Anticipate SVD. Hx of PPH and received 2u RBC,  will give TXA prior to delivery. #Pain: Epidural if able; patient also maybe okay with AROM and quick delivery #FWB: Cat I; EFW: 3500g #ID:  GBS unknown; declines hx of neonatal GBS sepsis #MOF: Bottle #MOC: Nexplanon #Circ:  NA; girl #Insufficient PNC, Hx of depression/anxiety: SW consult PP  Barrington Ellison, MD OB Family Medicine Fellow, [facility] for [facility], [facility] 03/24/2019, 8:37 AM   Attestation of Attending Supervision of Obstetric Fellow: Evaluation and management procedures were performed by the Obstetric Fellow under my supervision and collaboration.  I have reviewed the Obstetric Fellow's note and chart, and I agree with the management and plan.  Emeterio Reeve, MD, [facility] Attending Obstetrician &  Scientist, product/process development, [place]

## 2019-03-25 LAB — CBC
HCT: 25.6 % — ABNORMAL LOW (ref 36.0–46.0)
Hemoglobin: 7.3 g/dL — ABNORMAL LOW (ref 12.0–15.0)
MCH: 20.1 pg — ABNORMAL LOW (ref 26.0–34.0)
MCHC: 28.5 g/dL — ABNORMAL LOW (ref 30.0–36.0)
MCV: 70.5 fL — ABNORMAL LOW (ref 80.0–100.0)
Platelets: UNDETERMINED 10*3/uL (ref 150–400)
RBC: 3.63 MIL/uL — ABNORMAL LOW (ref 3.87–5.11)
RDW: 17.7 % — ABNORMAL HIGH (ref 11.5–15.5)
WBC: 14 10*3/uL — ABNORMAL HIGH (ref 4.0–10.5)
nRBC: 0.1 % (ref 0.0–0.2)

## 2019-03-25 MED ORDER — SODIUM CHLORIDE 0.9 % IV SOLN
510.0000 mg | Freq: Once | INTRAVENOUS | Status: DC
  Administered 2019-03-25: 510 mg via INTRAVENOUS
  Filled 2019-03-25: qty 17

## 2019-03-25 NOTE — Progress Notes (Signed)
Post Partum Day 1 Subjective: no complaints, up ad lib, voiding and tolerating PO, small lochia, plans to breastfeed, Depo-Provera  Objective: Blood pressure 122/76, pulse 87, temperature 97.6 F (36.4 C), temperature source Oral, resp. rate 20, height 5\' 5"  (1.651 m), SpO2 100 %, unknown if currently breastfeeding.  Physical Exam:  General: alert, cooperative and no distress Lochia:normal flow Chest: CTAB Heart: RRR no m/r/g Abdomen: +BS, soft, nontender,  Uterine Fundus: firm DVT Evaluation: No evidence of DVT seen on physical exam. Extremities: no edema  Recent Labs    03/24/19 0845 03/25/19 0536  HGB 8.7* 7.3*  HCT 30.9* 25.6*    Assessment/Plan: Plan for discharge tomorrow   LOS: 1 day   05/25/19 03/25/2019, 8:02 AM
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
## 2019-03-26 MED ORDER — IBUPROFEN 600 MG PO TABS: 600.0000 mg | ORAL_TABLET | Freq: Four times a day (QID) | ORAL | 0 refills | Status: DC | PRN

## 2019-03-26 MED ORDER — SERTRALINE HCL 25 MG PO TABS: 25.0000 mg | ORAL_TABLET | Freq: Every day | ORAL | 3 refills | Status: DC

## 2019-03-26 MED ORDER — SERTRALINE HCL 25 MG PO TABS
25.0000 mg | ORAL_TABLET | Freq: Every day | ORAL | Status: DC
  Administered 2019-03-26: 25 mg via ORAL
  Filled 2019-03-26: qty 1

## 2019-03-26 NOTE — Plan of Care (Signed)
  Problem: Education: Goal: Knowledge of General Education information will improve Description: Including pain rating scale, medication(s)/side effects and non-pharmacologic comfort measures Outcome: Completed/Met   Problem: Clinical Measurements: Goal: Ability to maintain clinical measurements within normal limits will improve Outcome: Completed/Met Goal: Will remain free from infection Outcome: Completed/Met Goal: Diagnostic test results will improve Outcome: Completed/Met   Problem: Pain Managment: Goal: General experience of comfort will improve Outcome: Completed/Met   Problem: Safety: Goal: Ability to remain free from injury will improve Outcome: Completed/Met   Problem: Skin Integrity: Goal: Risk for impaired skin integrity will decrease Outcome: Completed/Met

## 2019-03-26 NOTE — Progress Notes (Signed)
CSW was informed by RN that car seat was needed for infant. CSW provided MOB with car seat. [employer] Choice Model # [id number]  Mairyn Lenahan D. Dortha Kern, MSW, [facility] Clinical Social Worker [phone]

## 2019-03-26 NOTE — Lactation Note (Signed)
This note was copied from a baby's chart. Lactation Consultation Note  Patient Name: Daisy Thompson [id number] Date: 03/26/2019 Reason for consult: Initial assessment;Term Baby is 17 hours old.  Mom had chosen to formula feed on admission so had not been seen by lactation.  Mom is putting baby to breast some.  I asked her what her goals were and she said to do a little of both but mostly bottle feed.  She reports that baby is latching with ease.  Discussed milk coming to volume and the prevention and treatment of engorgement.  Manual pump given with instructions on use, cleaning and EBM storage.  Instructed to call for latch assist.  Breastfeeding consultation services information given and reviewed.  Maternal Data    Feeding Feeding Type: Bottle Fed - Formula Nipple Type: Slow - flow  LATCH Score                   Interventions Interventions: Hand pump  Lactation Tools Discussed/Used     Consult Status Consult Status: Complete    Trygg Mantz S 03/26/2019, 10:00 AM

## 2019-03-26 NOTE — Discharge Instructions (Signed)

## 2019-03-28 LAB — SURGICAL PATHOLOGY

## 2019-04-28 ENCOUNTER — Ambulatory Visit: Payer: Medicaid Other | Admitting: Advanced Practice Midwife

## 2019-05-02 DIAGNOSIS — F329 Major depressive disorder, single episode, unspecified: Secondary | ICD-10-CM | POA: Diagnosis not present

## 2019-05-09 DIAGNOSIS — F329 Major depressive disorder, single episode, unspecified: Secondary | ICD-10-CM | POA: Diagnosis not present

## 2019-05-18 DIAGNOSIS — F329 Major depressive disorder, single episode, unspecified: Secondary | ICD-10-CM | POA: Diagnosis not present

## 2019-05-25 DIAGNOSIS — F329 Major depressive disorder, single episode, unspecified: Secondary | ICD-10-CM | POA: Diagnosis not present

## 2019-05-31 DIAGNOSIS — F329 Major depressive disorder, single episode, unspecified: Secondary | ICD-10-CM | POA: Diagnosis not present

## 2019-06-08 DIAGNOSIS — F329 Major depressive disorder, single episode, unspecified: Secondary | ICD-10-CM | POA: Diagnosis not present

## 2019-07-11 ENCOUNTER — Ambulatory Visit: Payer: Medicaid Other | Admitting: Adult Health

## 2019-07-12 DIAGNOSIS — F329 Major depressive disorder, single episode, unspecified: Secondary | ICD-10-CM | POA: Diagnosis not present

## 2019-07-19 DIAGNOSIS — F329 Major depressive disorder, single episode, unspecified: Secondary | ICD-10-CM | POA: Diagnosis not present

## 2019-07-20 ENCOUNTER — Ambulatory Visit (INDEPENDENT_AMBULATORY_CARE_PROVIDER_SITE_OTHER): Payer: Medicaid Other | Admitting: Adult Health

## 2019-07-20 ENCOUNTER — Ambulatory Visit: Payer: Medicaid Other

## 2019-07-20 ENCOUNTER — Other Ambulatory Visit: Payer: Self-pay

## 2019-07-20 ENCOUNTER — Encounter: Payer: Self-pay | Admitting: Adult Health

## 2019-07-20 VITALS — BP 112/75 | HR 70 | Ht 65.0 in | Wt 172.0 lb

## 2019-07-20 DIAGNOSIS — Z7689 Persons encountering health services in other specified circumstances: Secondary | ICD-10-CM | POA: Diagnosis not present

## 2019-07-20 DIAGNOSIS — Z30013 Encounter for initial prescription of injectable contraceptive: Secondary | ICD-10-CM | POA: Diagnosis not present

## 2019-07-20 DIAGNOSIS — Z3202 Encounter for pregnancy test, result negative: Secondary | ICD-10-CM | POA: Diagnosis not present

## 2019-07-20 LAB — POCT URINE PREGNANCY: Preg Test, Ur: NEGATIVE

## 2019-07-20 MED ORDER — MEDROXYPROGESTERONE ACETATE 150 MG/ML IM SUSP: 150.0000 mg | INTRAMUSCULAR | 4 refills | Status: DC

## 2019-07-20 NOTE — Progress Notes (Signed)
  Subjective:     Patient ID: Daisy Thompson, female   DOB: Sep 18, 2000, 19 y.o.   MRN: [id number]  HPI [name] is a 19 year old biracial female,single, G2P2 in to discuss getting on depo, she thinks she had 03/25/19 in hospital after delivery.She has not had postpartum visit. PCP is Dr Meredeth Ide.   Review of Systems No period since delivery  Reviewed past medical,surgical, social and family history. Reviewed medications and allergies.     Objective:   Physical Exam BP 112/75 (BP Location: Left Arm, Patient Position: Sitting, Cuff Size: Normal)   Pulse 70   Ht 5\' 5"  (1.651 m)   Wt 172 lb (78 kg)   Breastfeeding No   BMI 28.62 kg/m UPT is negative, last sex 5-6 weeks ago with condom. Skin warm and dry. Neck: mid line trachea, normal thyroid, good ROM, no lymphadenopathy noted. Lungs: clear to ausculation bilaterally. Cardiovascular: regular rate and rhythm.    Assessment:     1. Pregnancy examination or test, negative result   2. Encounter for initial prescription of injectable contraceptive Will rx depo Meds ordered this encounter  Medications  . medroxyPROGESTERone (DEPO-PROVERA) 150 MG/ML injection    Sig: Inject 1 mL (150 mg total) into the muscle every 3 (three) months.    Dispense:  1 mL    Refill:  4    Order Specific Question:   Supervising Provider    Answer:   [[id number]]      Plan:     Go get depo and come back this pm or in am for injection, she is leaving for the beach in am Use condoms Will get postpartum appt. Scheduled ASAP

## 2019-08-01 ENCOUNTER — Ambulatory Visit: Payer: Medicaid Other | Admitting: Advanced Practice Midwife

## 2019-08-22 DIAGNOSIS — F329 Major depressive disorder, single episode, unspecified: Secondary | ICD-10-CM | POA: Diagnosis not present

## 2019-08-30 DIAGNOSIS — F329 Major depressive disorder, single episode, unspecified: Secondary | ICD-10-CM | POA: Diagnosis not present

## 2019-09-13 ENCOUNTER — Ambulatory Visit: Payer: Medicaid Other | Admitting: Women's Health

## 2019-10-13 DIAGNOSIS — F329 Major depressive disorder, single episode, unspecified: Secondary | ICD-10-CM | POA: Diagnosis not present

## 2019-10-17 ENCOUNTER — Ambulatory Visit: Payer: Medicaid Other | Admitting: Advanced Practice Midwife

## 2019-11-16 DIAGNOSIS — F329 Major depressive disorder, single episode, unspecified: Secondary | ICD-10-CM | POA: Diagnosis not present

## 2019-12-12 DIAGNOSIS — F4324 Adjustment disorder with disturbance of conduct: Secondary | ICD-10-CM | POA: Diagnosis not present

## 2020-04-06 ENCOUNTER — Other Ambulatory Visit: Payer: Medicaid Other

## 2020-05-22 ENCOUNTER — Other Ambulatory Visit: Payer: Self-pay

## 2020-05-22 ENCOUNTER — Other Ambulatory Visit (HOSPITAL_COMMUNITY)
Admission: RE | Admit: 2020-05-22 | Discharge: 2020-05-22 | Disposition: A | Discharge status: Home / Self Care | Payer: Medicaid Other | Source: Ambulatory Visit | Attending: Obstetrics & Gynecology | Admitting: Obstetrics & Gynecology

## 2020-05-22 ENCOUNTER — Other Ambulatory Visit (INDEPENDENT_AMBULATORY_CARE_PROVIDER_SITE_OTHER): Payer: Medicaid Other

## 2020-05-22 DIAGNOSIS — N926 Irregular menstruation, unspecified: Secondary | ICD-10-CM

## 2020-05-22 DIAGNOSIS — Z113 Encounter for screening for infections with a predominantly sexual mode of transmission: Secondary | ICD-10-CM

## 2020-05-22 DIAGNOSIS — Z3202 Encounter for pregnancy test, result negative: Secondary | ICD-10-CM

## 2020-05-22 LAB — POCT URINE PREGNANCY: Preg Test, Ur: NEGATIVE

## 2020-05-22 NOTE — Progress Notes (Signed)
Chart reviewed for nurse visit. Agree with plan of care.  Adline Potter, NP 05/22/2020 12:34 PM

## 2020-05-22 NOTE — Addendum Note (Signed)
Addended by: Moss Mc on: 05/22/2020 11:36 AM   Modules accepted: Orders

## 2020-05-22 NOTE — Progress Notes (Signed)
   NURSE VISIT- STD, Pregnancy test  SUBJECTIVE:  Daisy Thompson is a 20 y.o. [id number] GYN patientfemale here for a vaginal swab for STD screen and pregnancy test. LMP 04/13/20. Usually has a monthly period.  She reports the following symptoms: nausea.  Denies abnormal vaginal bleeding, significant pelvic pain, fever, or UTI symptoms.  OBJECTIVE:  There were no vitals taken for this visit.  Appears well, in no apparent distress  ASSESSMENT: Vaginal swab for STD screen  UPT neg  PLAN: Self-collected vaginal probe for Gonorrhea, Chlamydia, Trichomonas sent to lab Treatment: to be determined once results are received, HCG ordered Follow-up as needed if symptoms persist/worsen, or new symptoms develop  Jobe Marker  05/22/2020 11:35 AM

## 2020-05-23 LAB — CERVICOVAGINAL ANCILLARY ONLY
Chlamydia: NEGATIVE
Comment: NEGATIVE
Comment: NEGATIVE
Comment: NORMAL
Neisseria Gonorrhea: NEGATIVE
Trichomonas: NEGATIVE

## 2020-05-23 LAB — BETA HCG QUANT (REF LAB): hCG Quant: <1 m[IU]/mL

## 2020-05-28 DIAGNOSIS — Z20822 Contact with and (suspected) exposure to covid-19: Secondary | ICD-10-CM | POA: Diagnosis not present

## 2020-07-11 ENCOUNTER — Ambulatory Visit: Payer: Medicaid Other | Admitting: Adult Health

## 2020-08-13 ENCOUNTER — Other Ambulatory Visit: Payer: Medicaid Other

## 2020-11-07 DIAGNOSIS — Z20818 Contact with and (suspected) exposure to other bacterial communicable diseases: Secondary | ICD-10-CM | POA: Diagnosis not present

## 2020-11-07 DIAGNOSIS — J069 Acute upper respiratory infection, unspecified: Secondary | ICD-10-CM | POA: Diagnosis not present

## 2020-11-07 DIAGNOSIS — J029 Acute pharyngitis, unspecified: Secondary | ICD-10-CM | POA: Diagnosis not present

## 2021-02-28 ENCOUNTER — Ambulatory Visit: Payer: Medicaid Other | Admitting: Advanced Practice Midwife

## 2021-03-13 ENCOUNTER — Other Ambulatory Visit: Payer: Self-pay

## 2021-03-13 ENCOUNTER — Other Ambulatory Visit (HOSPITAL_COMMUNITY)
Admission: RE | Admit: 2021-03-13 | Discharge: 2021-03-13 | Disposition: A | Discharge status: Home / Self Care | Payer: Medicaid Other | Source: Ambulatory Visit | Attending: Obstetrics & Gynecology | Admitting: Obstetrics & Gynecology

## 2021-03-13 ENCOUNTER — Encounter: Payer: Self-pay | Admitting: Obstetrics & Gynecology

## 2021-03-13 ENCOUNTER — Ambulatory Visit: Payer: Medicaid Other | Admitting: Obstetrics & Gynecology

## 2021-03-13 VITALS — BP 132/84 | HR 96 | Ht 65.0 in | Wt 174.0 lb

## 2021-03-13 DIAGNOSIS — Z30011 Encounter for initial prescription of contraceptive pills: Secondary | ICD-10-CM | POA: Diagnosis not present

## 2021-03-13 DIAGNOSIS — R319 Hematuria, unspecified: Secondary | ICD-10-CM

## 2021-03-13 DIAGNOSIS — Z124 Encounter for screening for malignant neoplasm of cervix: Secondary | ICD-10-CM | POA: Diagnosis not present

## 2021-03-13 DIAGNOSIS — Z113 Encounter for screening for infections with a predominantly sexual mode of transmission: Secondary | ICD-10-CM | POA: Diagnosis not present

## 2021-03-13 LAB — POCT URINALYSIS DIPSTICK OB
Glucose, UA: NEGATIVE
Ketones, UA: NEGATIVE
Leukocytes, UA: NEGATIVE
Nitrite, UA: NEGATIVE

## 2021-03-13 MED ORDER — NORETHIN ACE-ETH ESTRAD-FE 1-20 MG-MCG(24) PO TABS: 1.0000 | ORAL_TABLET | Freq: Every day | ORAL | 4 refills | Status: DC

## 2021-03-13 NOTE — Progress Notes (Signed)
° °  GYN VISIT Patient name: Daisy Thompson MRN [id number]  Date of birth: 2000-01-30 Chief Complaint:   Hematuria  History of Present Illness:   Daisy Thompson is a 21 y.o. [phone]  female being seen today for the following concerns:     Hematuria: Notes blood in her urine x 49mos.  Notes pelvic pain.Urinary frequency and urgency.  Notes low back pain.  No fever or chills.  Vaginal irritation: Notes occasional itching, no discharge.  Denies pelvic pain.  Sexually active with same partner- using pull out only.  Patient's last menstrual period was 02/25/2021 (exact date).  Depression screen [name] 2/9 11/04/2018 04/07/2017 04/07/2017 05/14/2016  Decreased Interest 0 - - 0  Down, Depressed, Hopeless 0 - 2 0  PHQ - 2 Score 0 - 2 0  Altered sleeping - - 3 3  Tired, decreased energy - - 2 3  Change in appetite - - 1 0  Feeling bad or failure about yourself  - - 1 0  Trouble concentrating - - 0 0  Moving slowly or fidgety/restless - 0 0 0  Suicidal thoughts - - 0 0  PHQ-9 Score - - 9 6  Difficult doing work/chores - - Not difficult at all -     Review of Systems:   Pertinent items are noted in HPI Denies fever/chills, dizziness, headaches, visual disturbances, fatigue, shortness of breath, chest pain, abdominal pain, vomiting, no problems with periods, bowel movements, urination, or intercourse unless otherwise stated above.  Pertinent History Reviewed:  Reviewed past medical,surgical, social, obstetrical and family history.  Reviewed problem list, medications and allergies. Physical Assessment:   Vitals:   03/13/21 1342  BP: 132/84  Pulse: 96  Weight: 174 lb (78.9 kg)  Height: 5\' 5"  (1.651 m)  Body mass index is 28.96 kg/m.       Physical Examination:   General appearance: alert, well appearing, and in no distress  Psych: mood appropriate, normal affect  Skin: warm & dry   Cardiovascular: normal heart rate noted  Respiratory: normal respiratory effort, no distress  Abdomen: soft,  non-tender   Back: No CVA tenderness  Pelvic: normal external genitalia, vulva, vagina- blood noted in vault consistent with menses, cervix seen thought difficult due to patient discomfort   Extremities: no edema   Chaperone: Celene Squibb    Assessment & Plan:  1) Hematuria -plan to r/o underlying infection both urinary and vaginal  2) Cervical cancer screening -pap collected, reviewed guidelines  3) Contraceptive management -discussed options- pt ok with restarting pills OCP risk assessment: Pt denies personal history of VTE, stroke or heart attack.  Denies personal h/o breast cancer.  Pt is either a non-smoker or smoker under the age of 22yo.  Denies h/o migraines with aura    Orders Placed This Encounter  Procedures   POC Urinalysis Dipstick OB    No follow-ups on file.   Janyth Pupa, DO Attending [place], [facility] for [employer], [place]

## 2021-03-14 LAB — RPR: RPR Ser Ql: NONREACTIVE

## 2021-03-14 LAB — HIV ANTIBODY (ROUTINE TESTING W REFLEX): HIV Screen 4th Generation wRfx: NONREACTIVE

## 2021-03-15 ENCOUNTER — Other Ambulatory Visit: Payer: Self-pay | Admitting: Obstetrics & Gynecology

## 2021-03-15 DIAGNOSIS — B9689 Other specified bacterial agents as the cause of diseases classified elsewhere: Secondary | ICD-10-CM

## 2021-03-15 DIAGNOSIS — A749 Chlamydial infection, unspecified: Secondary | ICD-10-CM

## 2021-03-15 LAB — CERVICOVAGINAL ANCILLARY ONLY
Bacterial Vaginitis (gardnerella): POSITIVE — AB
Candida Glabrata: NEGATIVE
Candida Vaginitis: NEGATIVE
Chlamydia: POSITIVE — AB
Comment: NEGATIVE
Comment: NEGATIVE
Comment: NEGATIVE
Comment: NEGATIVE
Comment: NEGATIVE
Comment: NORMAL
Neisseria Gonorrhea: NEGATIVE
Trichomonas: NEGATIVE

## 2021-03-15 LAB — CYTOLOGY - PAP: Diagnosis: NEGATIVE

## 2021-03-15 MED ORDER — AZITHROMYCIN 500 MG PO TABS: 1000.0000 mg | ORAL_TABLET | Freq: Once | ORAL | 1 refills | Status: AC

## 2021-03-15 MED ORDER — METRONIDAZOLE 500 MG PO TABS: 500.0000 mg | ORAL_TABLET | Freq: Two times a day (BID) | ORAL | 0 refills | Status: AC

## 2021-03-15 NOTE — Progress Notes (Signed)
Prescriptions sent in for Chlamydia and BV

## 2021-03-18 LAB — URINE CULTURE

## 2021-03-19 ENCOUNTER — Other Ambulatory Visit: Payer: Self-pay | Admitting: Obstetrics & Gynecology

## 2021-03-19 DIAGNOSIS — N3001 Acute cystitis with hematuria: Secondary | ICD-10-CM

## 2021-03-19 MED ORDER — NITROFURANTOIN MONOHYD MACRO 100 MG PO CAPS: 100.0000 mg | ORAL_CAPSULE | Freq: Two times a day (BID) | ORAL | 0 refills | Status: AC

## 2021-03-19 NOTE — Progress Notes (Signed)
Rx sent in for UTI.

## 2021-04-08 ENCOUNTER — Other Ambulatory Visit: Payer: Self-pay

## 2021-04-08 ENCOUNTER — Other Ambulatory Visit (INDEPENDENT_AMBULATORY_CARE_PROVIDER_SITE_OTHER): Payer: Medicaid Other | Admitting: *Deleted

## 2021-04-08 ENCOUNTER — Other Ambulatory Visit (HOSPITAL_COMMUNITY)
Admission: RE | Admit: 2021-04-08 | Discharge: 2021-04-08 | Disposition: A | Discharge status: Home / Self Care | Payer: Medicaid Other | Source: Ambulatory Visit | Attending: Obstetrics & Gynecology | Admitting: Obstetrics & Gynecology

## 2021-04-08 DIAGNOSIS — A749 Chlamydial infection, unspecified: Secondary | ICD-10-CM

## 2021-04-08 DIAGNOSIS — Z113 Encounter for screening for infections with a predominantly sexual mode of transmission: Secondary | ICD-10-CM | POA: Insufficient documentation

## 2021-04-08 DIAGNOSIS — Z09 Encounter for follow-up examination after completed treatment for conditions other than malignant neoplasm: Secondary | ICD-10-CM | POA: Diagnosis not present

## 2021-04-08 NOTE — Progress Notes (Signed)
? ?  NURSE VISIT-STD/POC ? ?SUBJECTIVE:  ?Daisy Thompson is a 21 y.o. [id number] GYN patientfemale here for a vaginal swab for proof of cure after treatment for Chlamydia.  She reports the following symptoms: none for 0 days. ?Denies abnormal vaginal bleeding, significant pelvic pain, fever, or UTI symptoms. ? ?OBJECTIVE:  ?There were no vitals taken for this visit.  ?Appears well, in no apparent distress ? ?ASSESSMENT: ?Vaginal swab for proof of cure after treatment for CHL ? ?PLAN: ?Self-collected vaginal probe for Gonorrhea, Chlamydia sent to lab ?Treatment: to be determined once results are received ?Follow-up as needed if symptoms persist/worsen, or new symptoms develop ? ?Daisy Thompson  ?04/08/2021 ?2:46 PM ? ?

## 2021-04-10 LAB — CERVICOVAGINAL ANCILLARY ONLY
Chlamydia: NEGATIVE
Comment: NEGATIVE
Comment: NORMAL
Neisseria Gonorrhea: NEGATIVE

## 2021-05-23 ENCOUNTER — Encounter: Payer: Self-pay | Admitting: *Deleted

## 2021-06-06 ENCOUNTER — Other Ambulatory Visit: Payer: Medicaid Other

## 2022-01-09 ENCOUNTER — Other Ambulatory Visit: Payer: Self-pay | Admitting: Obstetrics & Gynecology

## 2022-01-09 DIAGNOSIS — Z30011 Encounter for initial prescription of contraceptive pills: Secondary | ICD-10-CM

## 2022-01-10 MED ORDER — NORETHIN ACE-ETH ESTRAD-FE 1-20 MG-MCG(24) PO TABS: ORAL_TABLET | ORAL | 3 refills | Status: DC

## 2022-01-23 ENCOUNTER — Encounter: Payer: Self-pay | Admitting: Adult Health

## 2022-01-23 ENCOUNTER — Other Ambulatory Visit (HOSPITAL_COMMUNITY)
Admission: RE | Admit: 2022-01-23 | Discharge: 2022-01-23 | Disposition: A | Discharge status: Home / Self Care | Payer: Medicaid Other | Source: Ambulatory Visit | Attending: Adult Health | Admitting: Adult Health

## 2022-01-23 ENCOUNTER — Ambulatory Visit: Payer: Medicaid Other | Admitting: Adult Health

## 2022-01-23 VITALS — BP 116/73 | HR 68 | Ht 65.0 in | Wt 156.2 lb

## 2022-01-23 DIAGNOSIS — Z113 Encounter for screening for infections with a predominantly sexual mode of transmission: Secondary | ICD-10-CM

## 2022-01-23 DIAGNOSIS — Z3041 Encounter for surveillance of contraceptive pills: Secondary | ICD-10-CM | POA: Diagnosis not present

## 2022-01-23 DIAGNOSIS — Z0000 Encounter for general adult medical examination without abnormal findings: Secondary | ICD-10-CM

## 2022-01-23 DIAGNOSIS — Z01419 Encounter for gynecological examination (general) (routine) without abnormal findings: Secondary | ICD-10-CM | POA: Insufficient documentation

## 2022-01-23 MED ORDER — NORETHIN ACE-ETH ESTRAD-FE 1-20 MG-MCG(24) PO TABS: ORAL_TABLET | ORAL | 4 refills | Status: AC

## 2022-01-23 MED ORDER — NORETHIN ACE-ETH ESTRAD-FE 1-20 MG-MCG(24) PO TABS: ORAL_TABLET | ORAL | 3 refills | Status: DC

## 2022-01-23 NOTE — Progress Notes (Signed)
Patient ID: Daisy Thompson, female   DOB: 17-Nov-2000, 22 y.o.   MRN: [id number] History of Present Illness: Daisy Thompson is a 22 year old female,single, G2P2002, in for well woman gyn exam and wants birth control refilled, has been off about a month.     Component Value Date/Time   DIAGPAP  03/13/2021 1351    - Negative for intraepithelial lesion or malignancy (NILM)   ADEQPAP  03/13/2021 1351    Satisfactory for evaluation; transformation zone component PRESENT.    Current Medications, Allergies, Past Medical History, Past Surgical History, Family History and Social History were reviewed in [employer] record.     Review of Systems: Patient denies any headaches, hearing loss, fatigue, blurred vision, shortness of breath, chest pain, abdominal pain, problems with bowel movements, urination, or intercourse. No joint pain or mood swings.     Physical Exam:BP 116/73 (BP Location: Right Arm, Patient Position: Sitting, Cuff Size: Normal)   Pulse 68   Ht 5\' 5"  (1.651 m)   Wt 156 lb 4 oz (70.9 kg)   LMP 01/09/2022   BMI 26.00 kg/m   General:  Well developed, well nourished, no acute distress Skin:  Warm and dry,has multiple tattoos and piercing's  Neck:  Midline trachea, normal thyroid, good ROM, no lymphadenopathy Lungs; Clear to auscultation bilaterally Breast:  No dominant palpable mass, retraction, or nipple discharge Cardiovascular: Regular rate and rhythm Abdomen:  Soft, non tender, no hepatosplenomegaly Pelvic:  External genitalia is normal in appearance, no lesions.  The vagina is normal in appearance. Urethra has no lesions or masses. The cervix is bulbous.  Uterus is felt to be normal size, shape, and contour.  No adnexal masses or tenderness noted.Bladder is non tender, no masses felt.CV swab obtained. Extremities/musculoskeletal:  No swelling or varicosities noted, no clubbing or cyanosis Psych:  No mood changes, alert and cooperative,seems happy AA is 0 Fall  risk is low    01/23/2022    3:19 PM 11/04/2018    1:57 PM 04/07/2017    3:18 PM  Depression screen PHQ 2/9  Decreased Interest 1 0   Down, Depressed, Hopeless 0 0   PHQ - 2 Score 1 0   Altered sleeping 2    Tired, decreased energy 2    Change in appetite 0    Feeling bad or failure about yourself  0    Trouble concentrating 0    Moving slowly or fidgety/restless 0  0  Suicidal thoughts 0    PHQ-9 Score 5    Difficult doing work/chores Somewhat difficult         01/23/2022    3:20 PM  GAD 7 : Generalized Anxiety Score  Nervous, Anxious, on Edge 0  Control/stop worrying 1  Worry too much - different things 1  Trouble relaxing 1  Restless 0  Easily annoyed or irritable 1  Afraid - awful might happen 0  Total GAD 7 Score 4  Anxiety Difficulty Somewhat difficult    Upstream - 01/23/22 1521       Pregnancy Intention Screening   Does the patient want to become pregnant in the next year? No    Does the patient's partner want to become pregnant in the next year? No    Would the patient like to discuss contraceptive options today? No      Contraception Wrap Up   Current Method Oral Contraceptive    End Method Oral Contraceptive    Contraception Counseling Provided No  Examination chaperoned by Dewitt Hoes RN   Impression and Plan: 1. Encounter for well woman exam with routine gynecological exam Physical in 1 year Pap in 2026  2. Encounter for surveillance of contraceptive pills Refilled Loestrin 24 fe Can start now and use condoms  Meds ordered this encounter  Medications   Norethindrone Acetate-Ethinyl Estrad-FE (LOESTRIN 24 FE) 1-20 MG-MCG(24) tablet    Sig: 1 tablet daily    Dispense:  90 tablet    Refill:  3    Order Specific Question:   Supervising Provider    Answer:   Elonda Husky, LUTHER H [[id number]]     3. Screen for STD (sexually transmitted disease) CV swab sent for GC/CHL and trich She declines blood labs

## 2022-01-27 LAB — CERVICOVAGINAL ANCILLARY ONLY
Chlamydia: NEGATIVE
Comment: NEGATIVE
Comment: NEGATIVE
Comment: NORMAL
Neisseria Gonorrhea: NEGATIVE
Trichomonas: NEGATIVE

## 2022-02-28 DIAGNOSIS — R0789 Other chest pain: Secondary | ICD-10-CM | POA: Diagnosis not present

## 2022-11-15 DIAGNOSIS — S32020A Wedge compression fracture of second lumbar vertebra, initial encounter for closed fracture: Secondary | ICD-10-CM | POA: Diagnosis not present

## 2022-11-15 DIAGNOSIS — R079 Chest pain, unspecified: Secondary | ICD-10-CM | POA: Diagnosis not present

## 2022-11-15 DIAGNOSIS — K5900 Constipation, unspecified: Secondary | ICD-10-CM | POA: Diagnosis not present

## 2022-11-15 DIAGNOSIS — M542 Cervicalgia: Secondary | ICD-10-CM | POA: Diagnosis not present

## 2022-11-15 DIAGNOSIS — R519 Headache, unspecified: Secondary | ICD-10-CM | POA: Diagnosis not present

## 2022-11-15 DIAGNOSIS — S6991XA Unspecified injury of right wrist, hand and finger(s), initial encounter: Secondary | ICD-10-CM | POA: Diagnosis not present

## 2022-11-15 DIAGNOSIS — S069X9A Unspecified intracranial injury with loss of consciousness of unspecified duration, initial encounter: Secondary | ICD-10-CM | POA: Diagnosis not present

## 2022-11-15 DIAGNOSIS — M79641 Pain in right hand: Secondary | ICD-10-CM | POA: Diagnosis not present

## 2022-11-15 DIAGNOSIS — S60221A Contusion of right hand, initial encounter: Secondary | ICD-10-CM | POA: Diagnosis not present

## 2022-11-15 DIAGNOSIS — R109 Unspecified abdominal pain: Secondary | ICD-10-CM | POA: Diagnosis not present

## 2022-11-21 DIAGNOSIS — S32000D Wedge compression fracture of unspecified lumbar vertebra, subsequent encounter for fracture with routine healing: Secondary | ICD-10-CM | POA: Diagnosis not present

## 2022-11-21 DIAGNOSIS — S32019D Unspecified fracture of first lumbar vertebra, subsequent encounter for fracture with routine healing: Secondary | ICD-10-CM | POA: Diagnosis not present

## 2022-11-21 DIAGNOSIS — S32029D Unspecified fracture of second lumbar vertebra, subsequent encounter for fracture with routine healing: Secondary | ICD-10-CM | POA: Diagnosis not present

## 2022-11-21 DIAGNOSIS — S39012D Strain of muscle, fascia and tendon of lower back, subsequent encounter: Secondary | ICD-10-CM | POA: Diagnosis not present

## 2022-12-04 DIAGNOSIS — M545 Low back pain, unspecified: Secondary | ICD-10-CM | POA: Diagnosis not present

## 2022-12-04 DIAGNOSIS — M25541 Pain in joints of right hand: Secondary | ICD-10-CM | POA: Diagnosis not present

## 2022-12-15 DIAGNOSIS — M545 Low back pain, unspecified: Secondary | ICD-10-CM | POA: Diagnosis not present

## 2022-12-15 DIAGNOSIS — M25541 Pain in joints of right hand: Secondary | ICD-10-CM | POA: Diagnosis not present

## 2023-10-09 ENCOUNTER — Encounter: Payer: Self-pay | Admitting: *Deleted

## 2023-11-24 ENCOUNTER — Other Ambulatory Visit (HOSPITAL_COMMUNITY)
Admission: RE | Admit: 2023-11-24 | Discharge: 2023-11-24 | Disposition: A | Discharge status: Home / Self Care | Source: Ambulatory Visit | Attending: Obstetrics & Gynecology | Admitting: Obstetrics & Gynecology

## 2023-11-24 ENCOUNTER — Ambulatory Visit (INDEPENDENT_AMBULATORY_CARE_PROVIDER_SITE_OTHER)

## 2023-11-24 DIAGNOSIS — N898 Other specified noninflammatory disorders of vagina: Secondary | ICD-10-CM | POA: Insufficient documentation

## 2023-11-24 DIAGNOSIS — Z113 Encounter for screening for infections with a predominantly sexual mode of transmission: Secondary | ICD-10-CM | POA: Diagnosis not present

## 2023-11-24 NOTE — Progress Notes (Signed)
   NURSE VISIT- VAGINITIS  SUBJECTIVE:  Daisy Thompson is a 23 y.o. [id number] GYN patientfemale here for a vaginal swab for vaginitis screening.  She reports the following symptoms: discharge described as white and odor for 2 weeks. Denies abnormal vaginal bleeding, significant pelvic pain, fever, or UTI symptoms.  OBJECTIVE:  There were no vitals taken for this visit.  Appears well, in no apparent distress  ASSESSMENT: Vaginal swab for vaginitis screening  PLAN: Self-collected vaginal probe for Gonorrhea, Chlamydia, Trichomonas, Bacterial Vaginosis, Yeast sent to lab Treatment: to be determined once results are received Follow-up as needed if symptoms persist/worsen, or new symptoms develop  Aleck FORBES Blase  11/24/2023 1:36 PM

## 2023-11-26 LAB — CERVICOVAGINAL ANCILLARY ONLY
Bacterial Vaginitis (gardnerella): POSITIVE — AB
Candida Glabrata: NEGATIVE
Candida Vaginitis: NEGATIVE
Chlamydia: NEGATIVE
Comment: NEGATIVE
Comment: NEGATIVE
Comment: NEGATIVE
Comment: NEGATIVE
Comment: NEGATIVE
Comment: NORMAL
Neisseria Gonorrhea: NEGATIVE
Trichomonas: NEGATIVE

## 2023-11-27 ENCOUNTER — Ambulatory Visit: Payer: Self-pay | Admitting: Adult Health

## 2023-11-27 MED ORDER — METRONIDAZOLE 500 MG PO TABS: 500.0000 mg | ORAL_TABLET | Freq: Two times a day (BID) | ORAL | 0 refills | Status: AC
